# Patient Record
Sex: Male | Born: 1965 | Race: White | Hispanic: No | Smoking: Current every day smoker
Health system: Southern US, Community
[De-identification: ages and names within clinical notes are randomized; demographics above are authoritative.]

## PROBLEM LIST (undated history)

## (undated) DIAGNOSIS — G8929 Other chronic pain: Secondary | ICD-10-CM

## (undated) DIAGNOSIS — K219 Gastro-esophageal reflux disease without esophagitis: Secondary | ICD-10-CM

## (undated) DIAGNOSIS — N200 Calculus of kidney: Secondary | ICD-10-CM

## (undated) HISTORY — DX: Gastro-esophageal reflux disease without esophagitis: K21.9

## (undated) HISTORY — PX: KNEE SURGERY: SHX244

## (undated) HISTORY — PX: ANKLE SURGERY: SHX546

## (undated) HISTORY — PX: HUMERUS SURGERY: SHX672

## (undated) HISTORY — PX: CHOLECYSTECTOMY: SHX55

## (undated) HISTORY — PX: OTHER SURGICAL HISTORY: SHX169

## (undated) HISTORY — PX: FOREARM FRACTURE SURGERY: SHX649

---

## 2005-08-05 ENCOUNTER — Emergency Department (HOSPITAL_COMMUNITY): Admission: EM | Admit: 2005-08-05 | Discharge: 2005-08-06 | Payer: Self-pay | Admitting: *Deleted

## 2007-01-17 ENCOUNTER — Ambulatory Visit: Payer: Self-pay | Admitting: Cardiology

## 2007-06-07 ENCOUNTER — Emergency Department (HOSPITAL_COMMUNITY): Admission: EM | Admit: 2007-06-07 | Discharge: 2007-06-07 | Payer: Self-pay | Admitting: *Deleted

## 2007-08-08 ENCOUNTER — Ambulatory Visit: Payer: Self-pay | Admitting: Gastroenterology

## 2007-08-15 ENCOUNTER — Ambulatory Visit: Payer: Self-pay | Admitting: Gastroenterology

## 2007-08-15 ENCOUNTER — Encounter: Payer: Self-pay | Admitting: Gastroenterology

## 2007-09-07 ENCOUNTER — Emergency Department (HOSPITAL_COMMUNITY): Admission: EM | Admit: 2007-09-07 | Discharge: 2007-09-07 | Payer: Self-pay | Admitting: Emergency Medicine

## 2008-02-25 DIAGNOSIS — K2940 Chronic atrophic gastritis without bleeding: Secondary | ICD-10-CM | POA: Insufficient documentation

## 2010-12-25 ENCOUNTER — Encounter: Payer: Self-pay | Admitting: Gastroenterology

## 2011-04-17 NOTE — Assessment & Plan Note (Signed)
[facility]                         GASTROENTEROLOGY OFFICE NOTE   VIPUL, CAFARELLI                         MRN:          [id number]  DATE:08/08/2007                            DOB:          06-29-1966    REASON FOR CONSULTATION:  Nausea, vomiting, epigastric pain and reflux  symptoms.   HISTORY OF PRESENT ILLNESS:  Mr. Koval is a 45 year old white male  followed by Dr. Sherryll Burger.  The patient relates over a 25 year history of  heartburn and indigestion.  He states he has been on various medications  over the years and most recently treated with Nexium 40 mg twice a day  and Ranitidine once a day.  Despite this regimen he has had worsening  problems with heartburn, indigestion, nausea, vomiting, regurgitation,  epigastric pain, lower chest pain, gas, bloating, loose stools and solid  food dysphagia for approximately 1 to 2 months.  He has had a weight  increase over the past several months as well.  A recent abdominal  ultrasound performed at [facility] on August 01, 2007 showed  diffuse fatty infiltrative changes of the liver and no other  abnormalities.  Patient has not previously had upper endoscopy or an  upper GI series.  There is no family history of colon cancer, colon  polyps or inflammatory bowel disease.  He denies odynophagia,  hematemesis, melena or hematochezia.  He states he has noted occasional  episodes in the past of small amounts of bright red blood when he has  had repeated vomiting.   PAST MEDICAL HISTORY:  1. Hypertriglyceridemia.  2. GERD.   CURRENT MEDICATIONS:  1. Nexium 40 mg p.o. b.i.d.  2. Ranitidine unknown dosage daily.   MEDICATION ALLERGIES:  NONE KNOWN.   SOCIAL HISTORY:  He is separated with 2 children.  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  He is a cigarette  smoker and drinks a mild to moderate amount of alcohol.   REVIEW OF SYSTEMS:  As per the handwritten form.   PHYSICAL EXAMINATION:  Overweight white male in no acute distress.  Height 6 feet, weight 242.4 pounds, blood pressure 152/86, pulse 76 and  regular.  HEENT:  Anicteric sclerae, oropharynx clear.  CHEST:  Clear to auscultation bilaterally.  CARDIAC:  Regular rate and rhythm without murmurs appreciated.  ABDOMEN:  Soft with mild epigastric tenderness to deep palpation, no  rebound or guarding.  No palpable organomegaly, masses or hernias.  Normoactive bowel sounds.  NEUROLOGIC:  Alert and oriented x3.  Grossly nonfocal.   ASSESSMENT/PLAN:  1. Presumed refractory reflux symptoms and dysphagia.  Rule out ulcer      disease, erosive esophagitis, strictures, HH and gastroparesis.      Continue Nexium 40 mg p.o. b.i.d. along with Ranitidine daily.      Begin metoclopramide 10 mg a.c. and nightly.  Begin all standard      anti-reflux measures including the use of 4 inch bed blocks.      Risks, benefits and alternatives to upper endoscopy with possible      biopsy and  possible dilation discussed with the patient and he      consents to proceed.  This will be scheduled electively.  Pending      findings we may need to proceed with a gastric emptying scan and an      esophageal monometry.  If his symptoms are all related to      refractory reflux disease consideration will be given to surgical      referral for possible Nissen fundoplication.  2. Fatty liver. Low fat diet and long term weight loss to be followed      by Dr. Sherryll Burger.     Venita Lick. Russella Dar, MD, [facility]  Electronically Signed    MTS/MedQ  DD: 08/08/2007  DT: 08/08/2007  Job #: [id number]   cc:   Kirstie Peri, MD

## 2011-04-20 NOTE — Consult Note (Signed)
NAME:  Scott Lozano, Scott Lozano NO.:  [id number]   MEDICAL RECORD NO.:  [id number]          PATIENT TYPE:  EMS   LOCATION:  [facility]                         FACILITY:  [facility]   PHYSICIAN:  Hilda Lias, M.D.   DATE OF BIRTH:  04-22-1966   DATE OF CONSULTATION:  08/05/2005  DATE OF DISCHARGE:                                   CONSULTATION   HISTORY OF PRESENT ILLNESS:  Mr. Borg was transferred from [facility].  The history goes that after Eber Jones called me and I talked to the  doctor in the emergency room at [facility] about this patient at  around 11 a.m. this morning.  He felt a pop sensation in his lower back.  As  well, he had a lot of pain down both legs.  The patient was seen in [place]  at 1 p.m.  They did a work-up and they called me about 6:15.  The history  was given me that he was unable to move his legs and they were worried about  a spinal cord injury.  I asked the doctor if he did a rectal examination.  He told me none was done, but he told me that a CT scan was negative.  I  agreed with the history and for the patient to be transferred to [facility].  I was going to call the MRI.  The next day, at about 9:30, the  patient still had not shown up in the [facility], on the  neurosurgical floor, and we called [facility].  The patient was still  there in the emergency room and the nurse who was taking care of him was  telling me that he was awake, moving his leg with minimal discomfort.  Also  she gave me the information that the patient's blood test was positive for  illegal drugs.  Because of that I told her that probably the patient needs  to be transferred to the emergency room to be seen there to learn the source  of the pain that he is having.  Nevertheless, after that the patient was  transferred and now he came to the emergency room.  Mick Deflack, who is the  nurse in charge of the hospital today, called me to find out  why I was not  letting the patient on my floor.  I explained to him that I cancelled the  transfer the patient was in the emergency room at [facility] because  of the changes in the information that I got from the nurses.  Nevertheless,  the patient came to the emergency room and I came immediately to see the  patient.  He is with his mother and his brother.   According to him, he tells me that many years ago, about four years, he had  surgery on his neck in [place] because of herniated disk.  He has known  that he has a bulge in the center of his back.  He, through the years, has  had some back pain which comes and goes, and  he tells me that every time he  has some back pain, he goes to a neighbor, from whom he gets Xanax and  diazepam, as well as a medication for pain.  The mother gave me information  that since he did not get any medication for pain, except Tramadol in the  emergency room at [facility], a friend brought him some Demerol and methadone  for him to take.  Right now he tells me that has some numbness that goes  from the hip to the knees, but from the knees down is normal.  He denies any  problem with his bladder or bowels.  He is able to move his leg, but he has  some pain when he moves his leg.  Past history is positive for cervical  fusion in [place] in the cervical area.   PHYSICAL EXAMINATION:  HEENT:  Normal.  NECK:  He has a scar anteriorly.  He is able to flex and extend with some  discomfort.  LUNGS:  Mild rhonchi bilaterally.  HEART:  Heart sounds normal.  ABDOMEN:  Normal.  EXTREMITIES:  Normal pulse.  NEUROLOGIC:  He is alert x3.  Cranial nerves normal.  Strength:  Normal in  the upper extremities.  Lower extremities he is able to lift both legs.  He  is able to flex the knees but he has quite a bit of pain, mostly in the  lumbar area.  Sensation:  Normal.  He has no priapism.  Reflexes 2+.  No  Babinski.  Sensation is normal to pinprick and touch.   There is no sensory  deficit whatsoever.  RECTAL:  Normal.  BACK:  Palpation of the lumbar spine is quite painful.  He has quite a bit  of spasm on the lumbar spine.   Unfortunately, the CT scan was not sent but the information I got from the  [facility] emergency room, it was negative for any fracture and they  did not see any evidence of any hematoma.   LABORATORY DATA:  The blood tests were positive for marijuana and cocaine.   IMPRESSION:  Lower back pain.  No evidence of any __________.   RECOMMENDATIONS:  I talked to him, his brother and mother.  I told him that  probably we can admit him to the hospital to have an MRI done but the  patient tells me that he wants to go home now that he is able to sit and go  ahead and take his medication and have the MRI done in [place] where he  knows people from the x-ray department.  In that case, he is going to be  discharged and he will call us as needed.           ______________________________  Hilda Lias, M.D.     EB/MEDQ  D:  08/05/2005  T:  08/06/2005  Job:  [id number]

## 2012-09-05 ENCOUNTER — Encounter (HOSPITAL_COMMUNITY): Payer: Self-pay | Admitting: *Deleted

## 2012-09-05 ENCOUNTER — Emergency Department (HOSPITAL_COMMUNITY)
Admission: EM | Admit: 2012-09-05 | Discharge: 2012-09-05 | Disposition: A | Discharge status: Home / Self Care | Payer: Medicaid Other | Attending: Emergency Medicine | Admitting: Emergency Medicine

## 2012-09-05 ENCOUNTER — Emergency Department (HOSPITAL_COMMUNITY): Payer: Medicaid Other

## 2012-09-05 DIAGNOSIS — R109 Unspecified abdominal pain: Secondary | ICD-10-CM | POA: Insufficient documentation

## 2012-09-05 DIAGNOSIS — Z9089 Acquired absence of other organs: Secondary | ICD-10-CM | POA: Insufficient documentation

## 2012-09-05 DIAGNOSIS — R319 Hematuria, unspecified: Secondary | ICD-10-CM | POA: Insufficient documentation

## 2012-09-05 DIAGNOSIS — Z79899 Other long term (current) drug therapy: Secondary | ICD-10-CM | POA: Insufficient documentation

## 2012-09-05 HISTORY — DX: Calculus of kidney: N20.0

## 2012-09-05 LAB — URINE MICROSCOPIC-ADD ON

## 2012-09-05 LAB — URINALYSIS, ROUTINE W REFLEX MICROSCOPIC
Glucose, UA: NEGATIVE mg/dL
Protein, ur: NEGATIVE mg/dL

## 2012-09-05 MED ORDER — ONDANSETRON HCL 4 MG/2ML IJ SOLN
4.0000 mg | Freq: Once | INTRAMUSCULAR | Status: AC
  Administered 2012-09-05: 4 mg via INTRAVENOUS
  Filled 2012-09-05: qty 2

## 2012-09-05 MED ORDER — METRONIDAZOLE 500 MG PO TABS
500.0000 mg | ORAL_TABLET | Freq: Once | ORAL | Status: AC
  Administered 2012-09-05: 500 mg via ORAL

## 2012-09-05 MED ORDER — HYDROMORPHONE HCL PF 1 MG/ML IJ SOLN
1.0000 mg | Freq: Once | INTRAMUSCULAR | Status: AC
  Administered 2012-09-05: 1 mg via INTRAVENOUS
  Filled 2012-09-05: qty 1

## 2012-09-05 MED ORDER — HYDROMORPHONE HCL PF 2 MG/ML IJ SOLN
2.0000 mg | Freq: Once | INTRAMUSCULAR | Status: AC
  Administered 2012-09-05: 2 mg via INTRAVENOUS
  Filled 2012-09-05: qty 1

## 2012-09-05 MED ORDER — METRONIDAZOLE 500 MG PO TABS
ORAL_TABLET | ORAL | Status: AC
  Filled 2012-09-05: qty 1

## 2012-09-05 MED ORDER — CIPROFLOXACIN HCL 250 MG PO TABS
500.0000 mg | ORAL_TABLET | Freq: Once | ORAL | Status: AC
  Administered 2012-09-05: 500 mg via ORAL

## 2012-09-05 MED ORDER — CIPROFLOXACIN HCL 250 MG PO TABS
ORAL_TABLET | ORAL | Status: AC
  Filled 2012-09-05: qty 2

## 2012-09-05 NOTE — ED Notes (Signed)
Bladder scanner preformed per Neville Route, PA. Patient had 61ml of urine recorded in bladder.
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
## 2012-09-05 NOTE — ED Notes (Signed)
Pt discharged. Pt stable at time of discharge. Medications reviewed pt has no questions regarding discharge at this time. Pt voiced understanding of discharge instructions.  

## 2012-09-05 NOTE — ED Notes (Signed)
Seen at [facility] and thought to have kidney stone, but unable to do ct.  Now having problem with dysuria.

## 2012-09-06 NOTE — ED Provider Notes (Signed)
Medical screening examination/treatment/procedure(s) were performed by non-physician practitioner and as supervising physician I was immediately available for consultation/collaboration.  Reichen Hutzler, MD 09/06/12 0105 

## 2014-06-08 ENCOUNTER — Emergency Department (HOSPITAL_COMMUNITY)
Admission: EM | Admit: 2014-06-08 | Discharge: 2014-06-08 | Disposition: A | Discharge status: Home / Self Care | Payer: Worker's Compensation | Attending: Emergency Medicine | Admitting: Emergency Medicine

## 2014-06-08 ENCOUNTER — Emergency Department (HOSPITAL_COMMUNITY): Payer: Worker's Compensation

## 2014-06-08 DIAGNOSIS — F17200 Nicotine dependence, unspecified, uncomplicated: Secondary | ICD-10-CM | POA: Insufficient documentation

## 2014-06-08 DIAGNOSIS — M545 Low back pain, unspecified: Secondary | ICD-10-CM | POA: Insufficient documentation

## 2014-06-08 DIAGNOSIS — Z79899 Other long term (current) drug therapy: Secondary | ICD-10-CM | POA: Insufficient documentation

## 2014-06-08 DIAGNOSIS — M25561 Pain in right knee: Secondary | ICD-10-CM

## 2014-06-08 DIAGNOSIS — M25569 Pain in unspecified knee: Secondary | ICD-10-CM | POA: Insufficient documentation

## 2014-06-08 DIAGNOSIS — Z87442 Personal history of urinary calculi: Secondary | ICD-10-CM | POA: Insufficient documentation

## 2014-06-08 MED ORDER — OXYCODONE-ACETAMINOPHEN 5-325 MG PO TABS: 2.0000 | ORAL_TABLET | ORAL | Status: DC | PRN

## 2014-06-08 MED ORDER — ONDANSETRON HCL 4 MG/2ML IJ SOLN
4.0000 mg | Freq: Once | INTRAMUSCULAR | Status: AC
  Administered 2014-06-08: 4 mg via INTRAVENOUS
  Filled 2014-06-08: qty 2

## 2014-06-08 MED ORDER — PROMETHAZINE HCL 25 MG PO TABS: 25.0000 mg | ORAL_TABLET | Freq: Four times a day (QID) | ORAL | Status: DC | PRN

## 2014-06-08 MED ORDER — MORPHINE SULFATE 4 MG/ML IJ SOLN
4.0000 mg | Freq: Once | INTRAMUSCULAR | Status: AC
  Administered 2014-06-08: 4 mg via INTRAVENOUS
  Filled 2014-06-08: qty 1

## 2014-06-08 NOTE — ED Notes (Addendum)
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 Medial knee pain 8/10 after 10mg  IV Morphine by EMS.
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
## 2014-06-08 NOTE — Discharge Instructions (Signed)
X-rays show no fracture.  You probably have injuried your medial collateral ligament on the right knee.  Followup with orthopedic surgeon. Phone number given. Medication for pain and nausea.  Knee immobilizer, crutches, ice, elevate

## 2015-01-26 ENCOUNTER — Emergency Department (HOSPITAL_COMMUNITY): Payer: Managed Care, Other (non HMO)

## 2015-01-26 ENCOUNTER — Encounter (HOSPITAL_COMMUNITY): Payer: Self-pay | Admitting: *Deleted

## 2015-01-26 ENCOUNTER — Emergency Department (HOSPITAL_COMMUNITY)
Admission: EM | Admit: 2015-01-26 | Discharge: 2015-01-26 | Disposition: A | Discharge status: Home / Self Care | Payer: Managed Care, Other (non HMO) | Attending: Emergency Medicine | Admitting: Emergency Medicine

## 2015-01-26 DIAGNOSIS — S0990XA Unspecified injury of head, initial encounter: Secondary | ICD-10-CM | POA: Diagnosis present

## 2015-01-26 DIAGNOSIS — S060X0A Concussion without loss of consciousness, initial encounter: Secondary | ICD-10-CM | POA: Diagnosis not present

## 2015-01-26 DIAGNOSIS — Y9389 Activity, other specified: Secondary | ICD-10-CM | POA: Diagnosis not present

## 2015-01-26 DIAGNOSIS — S3991XA Unspecified injury of abdomen, initial encounter: Secondary | ICD-10-CM | POA: Diagnosis not present

## 2015-01-26 DIAGNOSIS — Z87828 Personal history of other (healed) physical injury and trauma: Secondary | ICD-10-CM | POA: Insufficient documentation

## 2015-01-26 DIAGNOSIS — G8929 Other chronic pain: Secondary | ICD-10-CM | POA: Insufficient documentation

## 2015-01-26 DIAGNOSIS — Z8505 Personal history of malignant neoplasm of liver: Secondary | ICD-10-CM | POA: Diagnosis not present

## 2015-01-26 DIAGNOSIS — S59902A Unspecified injury of left elbow, initial encounter: Secondary | ICD-10-CM | POA: Insufficient documentation

## 2015-01-26 DIAGNOSIS — M25561 Pain in right knee: Secondary | ICD-10-CM | POA: Insufficient documentation

## 2015-01-26 DIAGNOSIS — Z79899 Other long term (current) drug therapy: Secondary | ICD-10-CM | POA: Insufficient documentation

## 2015-01-26 DIAGNOSIS — R4182 Altered mental status, unspecified: Secondary | ICD-10-CM | POA: Diagnosis not present

## 2015-01-26 DIAGNOSIS — Z87442 Personal history of urinary calculi: Secondary | ICD-10-CM | POA: Diagnosis not present

## 2015-01-26 DIAGNOSIS — Y9289 Other specified places as the place of occurrence of the external cause: Secondary | ICD-10-CM | POA: Diagnosis not present

## 2015-01-26 DIAGNOSIS — S79911A Unspecified injury of right hip, initial encounter: Secondary | ICD-10-CM | POA: Diagnosis not present

## 2015-01-26 DIAGNOSIS — Y998 Other external cause status: Secondary | ICD-10-CM | POA: Insufficient documentation

## 2015-01-26 DIAGNOSIS — Z720 Tobacco use: Secondary | ICD-10-CM | POA: Insufficient documentation

## 2015-01-26 DIAGNOSIS — W109XXA Fall (on) (from) unspecified stairs and steps, initial encounter: Secondary | ICD-10-CM | POA: Diagnosis not present

## 2015-01-26 HISTORY — DX: Other chronic pain: G89.29

## 2015-01-26 LAB — CBC WITH DIFFERENTIAL/PLATELET
BASOS ABS: 0.1 10*3/uL (ref 0.0–0.1)
Basophils Relative: 1 % (ref 0–1)
EOS PCT: 6 % — AB (ref 0–5)
Eosinophils Absolute: 0.4 10*3/uL (ref 0.0–0.7)
HEMATOCRIT: 42.2 % (ref 39.0–52.0)
HEMOGLOBIN: 14.7 g/dL (ref 13.0–17.0)
Lymphocytes Relative: 43 % (ref 12–46)
Lymphs Abs: 2.9 10*3/uL (ref 0.7–4.0)
MCH: 30.8 pg (ref 26.0–34.0)
MCHC: 34.8 g/dL (ref 30.0–36.0)
MCV: 88.5 fL (ref 78.0–100.0)
MONO ABS: 0.5 10*3/uL (ref 0.1–1.0)
MONOS PCT: 8 % (ref 3–12)
Neutro Abs: 2.7 10*3/uL (ref 1.7–7.7)
Neutrophils Relative %: 42 % — ABNORMAL LOW (ref 43–77)
Platelets: 252 10*3/uL (ref 150–400)
RBC: 4.77 MIL/uL (ref 4.22–5.81)
RDW: 12.2 % (ref 11.5–15.5)
WBC: 6.5 10*3/uL (ref 4.0–10.5)

## 2015-01-26 LAB — AMMONIA: Ammonia: 82 umol/L — ABNORMAL HIGH (ref 11–32)

## 2015-01-26 LAB — URINALYSIS, ROUTINE W REFLEX MICROSCOPIC
Bilirubin Urine: NEGATIVE
GLUCOSE, UA: NEGATIVE mg/dL
HGB URINE DIPSTICK: NEGATIVE
Ketones, ur: NEGATIVE mg/dL
LEUKOCYTES UA: NEGATIVE
Nitrite: NEGATIVE
Protein, ur: NEGATIVE mg/dL
SPECIFIC GRAVITY, URINE: 1.019 (ref 1.005–1.030)
UROBILINOGEN UA: 0.2 mg/dL (ref 0.0–1.0)
pH: 5.5 (ref 5.0–8.0)

## 2015-01-26 LAB — COMPREHENSIVE METABOLIC PANEL
ALK PHOS: 81 U/L (ref 39–117)
ALT: 14 U/L (ref 0–53)
ANION GAP: 6 (ref 5–15)
AST: 17 U/L (ref 0–37)
Albumin: 3.5 g/dL (ref 3.5–5.2)
BILIRUBIN TOTAL: 0.3 mg/dL (ref 0.3–1.2)
BUN: 13 mg/dL (ref 6–23)
CO2: 32 mmol/L (ref 19–32)
Calcium: 9.2 mg/dL (ref 8.4–10.5)
Chloride: 100 mmol/L (ref 96–112)
Creatinine, Ser: 1.16 mg/dL (ref 0.50–1.35)
GFR, EST AFRICAN AMERICAN: 85 mL/min — AB (ref >90)
GFR, EST NON AFRICAN AMERICAN: 73 mL/min — AB (ref >90)
GLUCOSE: 110 mg/dL — AB (ref 70–99)
POTASSIUM: 4.5 mmol/L (ref 3.5–5.1)
SODIUM: 138 mmol/L (ref 135–145)
Total Protein: 6.9 g/dL (ref 6.0–8.3)

## 2015-01-26 MED ORDER — OXYCODONE HCL 5 MG PO TABS
10.0000 mg | ORAL_TABLET | Freq: Once | ORAL | Status: AC
  Administered 2015-01-26: 10 mg via ORAL
  Filled 2015-01-26: qty 2

## 2015-01-26 MED ORDER — SUCRALFATE 1 GM/10ML PO SUSP: 1.0000 g | Freq: Three times a day (TID) | ORAL | Status: DC

## 2015-01-26 NOTE — ED Notes (Signed)
Patient had surgery on his knee in August.  He has had frequent falls due to coming down steps.  He states his knee gives away.  Patient states he has hit his head multiple times.  He states he has a burning pain in his head.  He states is not himself and feels uncoordinated.  Patient has had 7 falls since Saturday.  Patient with weakness all over, worse 2 days ago with a droop in his face and altered gait.  He also has ringing in his ears.  Patient is alert.  He is able to verbalized his needs.  He states he has pain in both knees, and left elbow.  Patient reported to fall down 13 steps today.  Patient has not taken any pain meds today.  Patient denies loc.  He is also complaining of nausea.  He has not been able to keep anything down for 2 weeks.  He has not had bm for 4 days

## 2015-01-26 NOTE — ED Notes (Signed)
Abby PA at bedside
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
## 2015-01-26 NOTE — ED Notes (Signed)
Patient transported to MRI 

## 2015-01-26 NOTE — Discharge Instructions (Signed)
Concussion  A concussion, or closed-head injury, is a brain injury caused by a direct blow to the head or by a quick and sudden movement (jolt) of the head or neck. Concussions are usually not life-threatening. Even so, the effects of a concussion can be serious. If you have had a concussion before, you are more likely to experience concussion-like symptoms after a direct blow to the head.   CAUSES  · Direct blow to the head, such as from running into another player during a soccer game, being hit in a fight, or hitting your head on a hard surface.  · A jolt of the head or neck that causes the brain to move back and forth inside the skull, such as in a car crash.  SIGNS AND SYMPTOMS  The signs of a concussion can be hard to notice. Early on, they may be missed by you, family members, and health care providers. You may look fine but act or feel differently.  Symptoms are usually temporary, but they may last for days, weeks, or even longer. Some symptoms may appear right away while others may not show up for hours or days. Every head injury is different. Symptoms include:  · Mild to moderate headaches that will not go away.  · A feeling of pressure inside your head.  · Having more trouble than usual:  · Learning or remembering things you have heard.  · Answering questions.  · Paying attention or concentrating.  · Organizing daily tasks.  · Making decisions and solving problems.  · Slowness in thinking, acting or reacting, speaking, or reading.  · Getting lost or being easily confused.  · Feeling tired all the time or lacking energy (fatigued).  · Feeling drowsy.  · Sleep disturbances.  · Sleeping more than usual.  · Sleeping less than usual.  · Trouble falling asleep.  · Trouble sleeping (insomnia).  · Loss of balance or feeling lightheaded or dizzy.  · Nausea or vomiting.  · Numbness or tingling.  · Increased sensitivity to:  · Sounds.  · Lights.  · Distractions.  · Vision problems or eyes that tire  easily.  · Diminished sense of taste or smell.  · Ringing in the ears.  · Mood changes such as feeling sad or anxious.  · Becoming easily irritated or angry for little or no reason.  · Lack of motivation.  · Seeing or hearing things other people do not see or hear (hallucinations).  DIAGNOSIS  Your health care provider can usually diagnose a concussion based on a description of your injury and symptoms. He or she will ask whether you passed out (lost consciousness) and whether you are having trouble remembering events that happened right before and during your injury.  Your evaluation might include:  · A brain scan to look for signs of injury to the brain. Even if the test shows no injury, you may still have a concussion.  · Blood tests to be sure other problems are not present.  TREATMENT  · Concussions are usually treated in an emergency department, in urgent care, or at a clinic. You may need to stay in the hospital overnight for further treatment.  · Tell your health care provider if you are taking any medicines, including prescription medicines, over-the-counter medicines, and natural remedies. Some medicines, such as blood thinners (anticoagulants) and aspirin, may increase the chance of complications. Also tell your health care provider whether you have had alcohol or are taking illegal drugs. This information   may affect treatment.  · Your health care provider will send you home with important instructions to follow.  · How fast you will recover from a concussion depends on many factors. These factors include how severe your concussion is, what part of your brain was injured, your age, and how healthy you were before the concussion.  · Most people with mild injuries recover fully. Recovery can take time. In general, recovery is slower in older persons. Also, persons who have had a concussion in the past or have other medical problems may find that it takes longer to recover from their current injury.  HOME  CARE INSTRUCTIONS  General Instructions  · Carefully follow the directions your health care provider gave you.  · Only take over-the-counter or prescription medicines for pain, discomfort, or fever as directed by your health care provider.  · Take only those medicines that your health care provider has approved.  · Do not drink alcohol until your health care provider says you are well enough to do so. Alcohol and certain other drugs may slow your recovery and can put you at risk of further injury.  · If it is harder than usual to remember things, write them down.  · If you are easily distracted, try to do one thing at a time. For example, do not try to watch TV while fixing dinner.  · Talk with family members or close friends when making important decisions.  · Keep all follow-up appointments. Repeated evaluation of your symptoms is recommended for your recovery.  · Watch your symptoms and tell others to do the same. Complications sometimes occur after a concussion. Older adults with a brain injury may have a higher risk of serious complications, such as a blood clot on the brain.  · Tell your teachers, school nurse, school counselor, coach, athletic trainer, or work manager about your injury, symptoms, and restrictions. Tell them about what you can or cannot do. They should watch for:  ¨ Increased problems with attention or concentration.  ¨ Increased difficulty remembering or learning new information.  ¨ Increased time needed to complete tasks or assignments.  ¨ Increased irritability or decreased ability to cope with stress.  ¨ Increased symptoms.  · Rest. Rest helps the brain to heal. Make sure you:  ¨ Get plenty of sleep at night. Avoid staying up late at night.  ¨ Keep the same bedtime hours on weekends and weekdays.  ¨ Rest during the day. Take daytime naps or rest breaks when you feel tired.  · Limit activities that require a lot of thought or concentration. These include:  ¨ Doing homework or job-related  work.  ¨ Watching TV.  ¨ Working on the computer.  · Avoid any situation where there is potential for another head injury (football, hockey, soccer, basketball, martial arts, downhill snow sports and horseback riding). Your condition will get worse every time you experience a concussion. You should avoid these activities until you are evaluated by the appropriate follow-up health care providers.  Returning To Your Regular Activities  You will need to return to your normal activities slowly, not all at once. You must give your body and brain enough time for recovery.  · Do not return to sports or other athletic activities until your health care provider tells you it is safe to do so.  · Ask your health care provider when you can drive, ride a bicycle, or operate heavy machinery. Your ability to react may be slower after a   brain injury. Never do these activities if you are dizzy.  · Ask your health care provider about when you can return to work or school.  Preventing Another Concussion  It is very important to avoid another brain injury, especially before you have recovered. In rare cases, another injury can lead to permanent brain damage, brain swelling, or death. The risk of this is greatest during the first 7-10 days after a head injury. Avoid injuries by:  · Wearing a seat belt when riding in a car.  · Drinking alcohol only in moderation.  · Wearing a helmet when biking, skiing, skateboarding, skating, or doing similar activities.  · Avoiding activities that could lead to a second concussion, such as contact or recreational sports, until your health care provider says it is okay.  · Taking safety measures in your home.  ¨ Remove clutter and tripping hazards from floors and stairways.  ¨ Use grab bars in bathrooms and handrails by stairs.  ¨ Place non-slip mats on floors and in bathtubs.  ¨ Improve lighting in dim areas.  SEEK MEDICAL CARE IF:  · You have increased problems paying attention or  concentrating.  · You have increased difficulty remembering or learning new information.  · You need more time to complete tasks or assignments than before.  · You have increased irritability or decreased ability to cope with stress.  · You have more symptoms than before.  Seek medical care if you have any of the following symptoms for more than 2 weeks after your injury:  · Lasting (chronic) headaches.  · Dizziness or balance problems.  · Nausea.  · Vision problems.  · Increased sensitivity to noise or light.  · Depression or mood swings.  · Anxiety or irritability.  · Memory problems.  · Difficulty concentrating or paying attention.  · Sleep problems.  · Feeling tired all the time.  SEEK IMMEDIATE MEDICAL CARE IF:  · You have severe or worsening headaches. These may be a sign of a blood clot in the brain.  · You have weakness (even if only in one hand, leg, or part of the face).  · You have numbness.  · You have decreased coordination.  · You vomit repeatedly.  · You have increased sleepiness.  · One pupil is larger than the other.  · You have convulsions.  · You have slurred speech.  · You have increased confusion. This may be a sign of a blood clot in the brain.  · You have increased restlessness, agitation, or irritability.  · You are unable to recognize people or places.  · You have neck pain.  · It is difficult to wake you up.  · You have unusual behavior changes.  · You lose consciousness.  MAKE SURE YOU:  · Understand these instructions.  · Will watch your condition.  · Will get help right away if you are not doing well or get worse.  Document Released: 02/09/2004 Document Revised: 11/24/2013 Document Reviewed: 06/11/2013  Exit[employer]® Patient Information ©2015 [employer]. This information is not intended to replace advice given to you by your health care provider. Make sure you discuss any questions you have with your health care provider.  Post-Concussion Syndrome  Post-concussion syndrome describes the  symptoms that can occur after a head injury. These symptoms can last from weeks to months.  CAUSES   It is not clear why some head injuries cause post-concussion syndrome. It can occur whether your head injury was mild or severe   and whether you were wearing head protection or not.   SIGNS AND SYMPTOMS  · Memory difficulties.  · Dizziness.  · Headaches.  · Double vision or blurry vision.  · Sensitivity to light.  · Hearing difficulties.  · Depression.  · Tiredness.  · Weakness.  · Difficulty with concentration.  · Difficulty sleeping or staying asleep.  · Vomiting.  · Poor balance or instability on your feet.  · Slow reaction time.  · Difficulty learning and remembering things you have heard.  DIAGNOSIS   There is no test to determine whether you have post-concussion syndrome. Your health care provider may order an imaging scan of your brain, such as a CT scan, to check for other problems that may be causing your symptoms (such as severe injury inside your skull).  TREATMENT   Usually, these problems disappear over time without medical care. Your health care provider may prescribe medicine to help ease your symptoms. It is important to follow up with a neurologist to evaluate your recovery and address any lingering symptoms or issues.  HOME CARE INSTRUCTIONS   · Only take over-the-counter or prescription medicines for pain, discomfort, or fever as directed by your health care provider. Do not take aspirin. Aspirin can slow blood clotting.  · Sleep with your head slightly elevated to help with headaches.  · Avoid any situation where there is potential for another head injury (football, hockey, soccer, basketball, martial arts, downhill snow sports, and horseback riding). Your condition will get worse every time you experience a concussion. You should avoid these activities until you are evaluated by the appropriate follow-up health care providers.  · Keep all follow-up appointments as directed by your health care  provider.  SEEK IMMEDIATE MEDICAL CARE IF:  · You develop confusion or unusual drowsiness.  · You cannot wake the injured person.  · You develop nausea or persistent, forceful vomiting.  · You feel like you are moving when you are not (vertigo).  · You notice the injured person's eyes moving rapidly back and forth. This may be a sign of vertigo.  · You have convulsions or faint.  · You have severe, persistent headaches that are not relieved by medicine.  · You cannot use your arms or legs normally.  · Your pupils change size.  · You have clear or bloody discharge from the nose or ears.  · Your problems are getting worse, not better.  MAKE SURE YOU:  · Understand these instructions.  · Will watch your condition.  · Will get help right away if you are not doing well or get worse.  Document Released: 05/11/2002 Document Revised: 09/09/2013 Document Reviewed: 02/24/2014  Exit[employer]® Patient Information ©2015 [employer]. This information is not intended to replace advice given to you by your health care provider. Make sure you discuss any questions you have with your health care provider.

## 2015-03-07 ENCOUNTER — Ambulatory Visit (INDEPENDENT_AMBULATORY_CARE_PROVIDER_SITE_OTHER): Payer: Managed Care, Other (non HMO) | Admitting: Neurology

## 2015-03-07 ENCOUNTER — Encounter: Payer: Self-pay | Admitting: Neurology

## 2015-03-07 VITALS — BP 120/76 | HR 88 | Ht 72.0 in | Wt 197.0 lb

## 2015-03-07 DIAGNOSIS — R296 Repeated falls: Secondary | ICD-10-CM

## 2015-03-07 DIAGNOSIS — G44309 Post-traumatic headache, unspecified, not intractable: Secondary | ICD-10-CM | POA: Insufficient documentation

## 2015-03-07 MED ORDER — AMITRIPTYLINE HCL 10 MG PO TABS: ORAL_TABLET | ORAL | Status: DC

## 2015-03-07 NOTE — Patient Instructions (Signed)
1. Start amitriptyline 10mg : Take 1 tablet at bedtime for 1 week, then increase to 2 tablets at bedtime for 1 week, then increase to 3 tablets at bedtime. Monitor for drowsiness 2. Use cane at all times 3. Follow-up in 2 months
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
## 2015-03-13 ENCOUNTER — Encounter: Payer: Self-pay | Admitting: Neurology

## 2015-03-13 DIAGNOSIS — R296 Repeated falls: Secondary | ICD-10-CM | POA: Insufficient documentation

## 2015-05-06 DIAGNOSIS — Z029 Encounter for administrative examinations, unspecified: Secondary | ICD-10-CM

## 2015-05-08 ENCOUNTER — Encounter (HOSPITAL_COMMUNITY): Payer: Self-pay | Admitting: Emergency Medicine

## 2015-05-08 ENCOUNTER — Emergency Department (HOSPITAL_COMMUNITY)
Admission: EM | Admit: 2015-05-08 | Discharge: 2015-05-08 | Disposition: A | Discharge status: Home / Self Care | Payer: Managed Care, Other (non HMO) | Attending: Emergency Medicine | Admitting: Emergency Medicine

## 2015-05-08 ENCOUNTER — Emergency Department (HOSPITAL_COMMUNITY): Payer: Managed Care, Other (non HMO)

## 2015-05-08 DIAGNOSIS — Y998 Other external cause status: Secondary | ICD-10-CM | POA: Diagnosis not present

## 2015-05-08 DIAGNOSIS — S6991XA Unspecified injury of right wrist, hand and finger(s), initial encounter: Secondary | ICD-10-CM | POA: Diagnosis present

## 2015-05-08 DIAGNOSIS — Y9389 Activity, other specified: Secondary | ICD-10-CM | POA: Insufficient documentation

## 2015-05-08 DIAGNOSIS — S60221A Contusion of right hand, initial encounter: Secondary | ICD-10-CM | POA: Insufficient documentation

## 2015-05-08 DIAGNOSIS — K219 Gastro-esophageal reflux disease without esophagitis: Secondary | ICD-10-CM | POA: Insufficient documentation

## 2015-05-08 DIAGNOSIS — Y9289 Other specified places as the place of occurrence of the external cause: Secondary | ICD-10-CM | POA: Diagnosis not present

## 2015-05-08 DIAGNOSIS — W108XXA Fall (on) (from) other stairs and steps, initial encounter: Secondary | ICD-10-CM | POA: Diagnosis not present

## 2015-05-08 DIAGNOSIS — Z87442 Personal history of urinary calculi: Secondary | ICD-10-CM | POA: Insufficient documentation

## 2015-05-08 DIAGNOSIS — G8929 Other chronic pain: Secondary | ICD-10-CM | POA: Insufficient documentation

## 2015-05-08 DIAGNOSIS — Z79899 Other long term (current) drug therapy: Secondary | ICD-10-CM | POA: Diagnosis not present

## 2015-05-08 MED ORDER — OXYCODONE-ACETAMINOPHEN 5-325 MG PO TABS
1.0000 | ORAL_TABLET | Freq: Once | ORAL | Status: AC
  Administered 2015-05-08: 1 via ORAL
  Filled 2015-05-08: qty 1
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
## 2015-05-08 NOTE — ED Notes (Signed)
Patient c/o right hand pain that radiates into right forearm. Patient states that he was falling down steps and caught self with hand, feeling hand and wrist pop. Per patient feels throbbing sensation in hand. Denies taking any medication for pain.

## 2015-05-08 NOTE — Discharge Instructions (Signed)
Read the information below.  You may return to the Emergency Department at any time for worsening condition or any new symptoms that concern you.  If you develop uncontrolled pain, weakness or numbness of the extremity, severe discoloration of the skin, or you are unable to move your hand, return to the ER for a recheck.      Hand Contusion A hand contusion is a deep bruise on your hand area. Contusions are the result of an injury that caused bleeding under the skin. The contusion may turn blue, purple, or yellow. Minor injuries will give you a painless contusion, but more severe contusions may stay painful and swollen for a few weeks. CAUSES  A contusion is usually caused by a blow, trauma, or direct force to an area of the body. SYMPTOMS   Swelling and redness of the injured area.  Discoloration of the injured area.  Tenderness and soreness of the injured area.  Pain. DIAGNOSIS  The diagnosis can be made by taking a history and performing a physical exam. An X-ray, CT scan, or MRI may be needed to determine if there were any associated injuries, such as broken bones (fractures). TREATMENT  Often, the best treatment for a hand contusion is resting, elevating, icing, and applying cold compresses to the injured area. Over-the-counter medicines may also be recommended for pain control. HOME CARE INSTRUCTIONS   Put ice on the injured area.  Put ice in a plastic bag.  Place a towel between your skin and the bag.  Leave the ice on for 15-20 minutes, 03-04 times a day.  Only take over-the-counter or prescription medicines as directed by your caregiver. Your caregiver may recommend avoiding anti-inflammatory medicines (aspirin, ibuprofen, and naproxen) for 48 hours because these medicines may increase bruising.  If told, use an elastic wrap as directed. This can help reduce swelling. You may remove the wrap for sleeping, showering, and bathing. If your fingers become numb, cold, or blue,  take the wrap off and reapply it more loosely.  Elevate your hand with pillows to reduce swelling.  Avoid overusing your hand if it is painful. SEEK IMMEDIATE MEDICAL CARE IF:   You have increased redness, swelling, or pain in your hand.  Your swelling or pain is not relieved with medicines.  You have loss of feeling in your hand or are unable to move your fingers.  Your hand turns cold or blue.  You have pain when you move your fingers.  Your hand becomes warm to the touch.  Your contusion does not improve in 2 days. MAKE SURE YOU:   Understand these instructions.  Will watch your condition.  Will get help right away if you are not doing well or get worse. Document Released: 05/11/2002 Document Revised: 08/13/2012 Document Reviewed: 05/12/2012 [facility] Patient Information 2015 [employer] [place], [place]. This information is not intended to replace advice given to you by your health care provider. Make sure you discuss any questions you have with your health care provider.

## 2015-05-09 ENCOUNTER — Encounter: Payer: Self-pay | Admitting: Neurology

## 2015-05-09 ENCOUNTER — Ambulatory Visit: Payer: Self-pay | Admitting: Neurology
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
## 2016-01-24 IMAGING — MR MR HEAD W/O CM
9 of 11 series · 35 of 48 positions shown · non-contrast
Comparison: CT head 01/26/2015

CLINICAL DATA: Altered mental status. Multiple falls. Head injury.
Headache.

EXAM:
MRI HEAD WITHOUT CONTRAST
TECHNIQUE: Multiplanar, multiecho pulse sequences of the brain and surrounding
structures were obtained without intravenous contrast.

[Series 2: FLAIR · sagittal · 5.0mm · 0.47mm/px · 1 of 23 slices shown (1 of 2)]
[im 1/23]
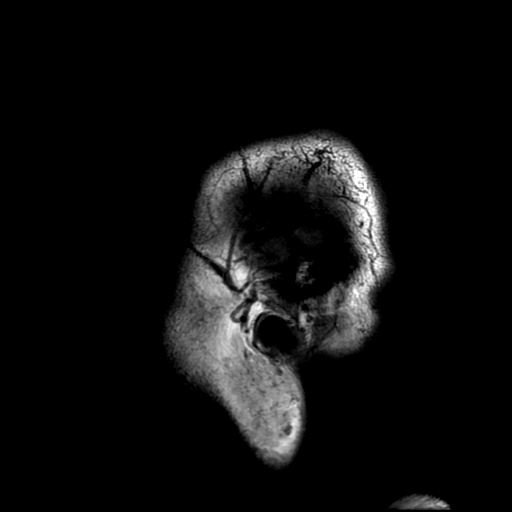

[Series 4: DWI · axial · 3.0mm · 0.94mm/px · z∈[-87,+51]mm · 9 of 94 slices shown (1 of 4)]
[im 1/94]
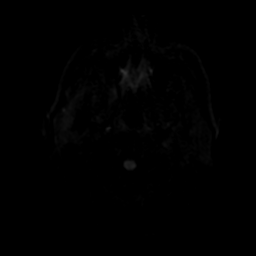
[im 12/94]
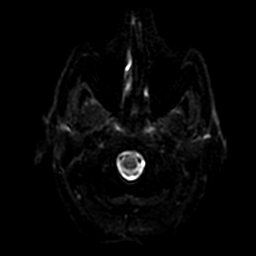
[im 24/94]
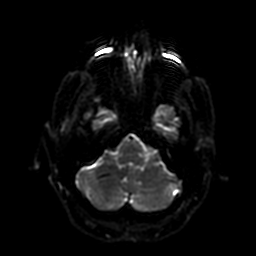
[im 35/94]
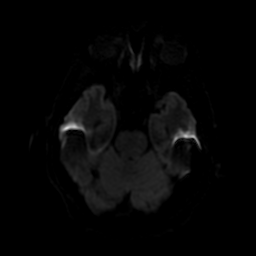
[im 47/94]
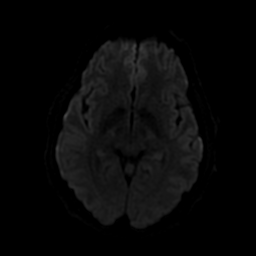
[im 59/94]
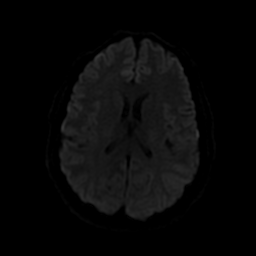
[im 70/94]
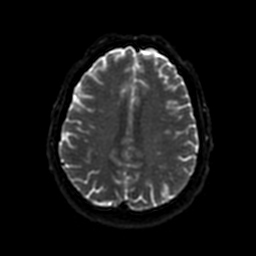
[im 82/94]
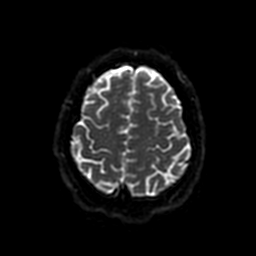
[im 94/94]
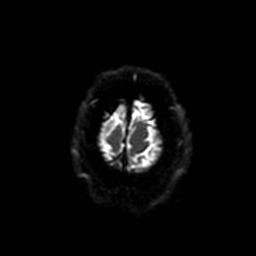

[Series 5: (person_name) · axial · 3.0mm · 0.47mm/px · z∈[-88,+0]mm · 5 of 96 slices shown]
[im 1/96]
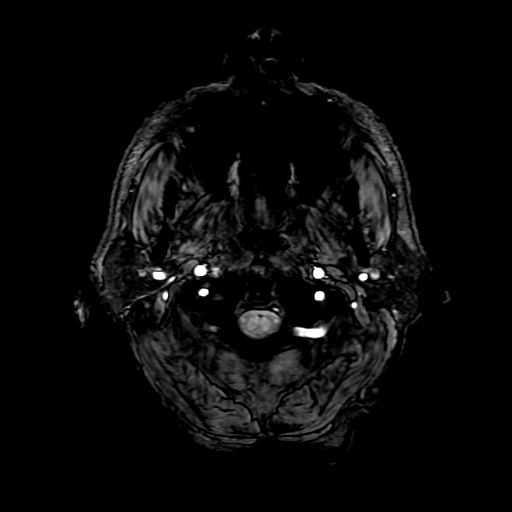
[im 12/96]
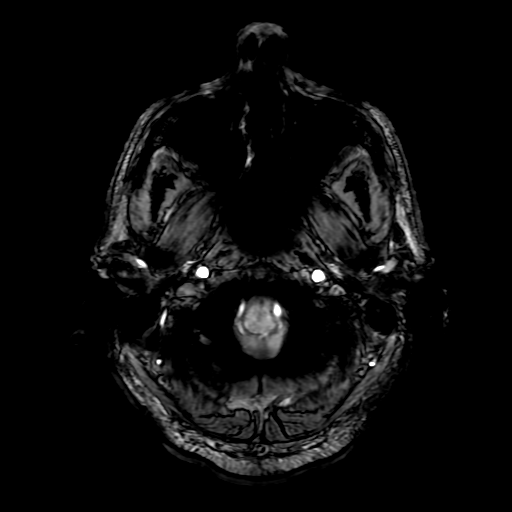
[im 24/96]
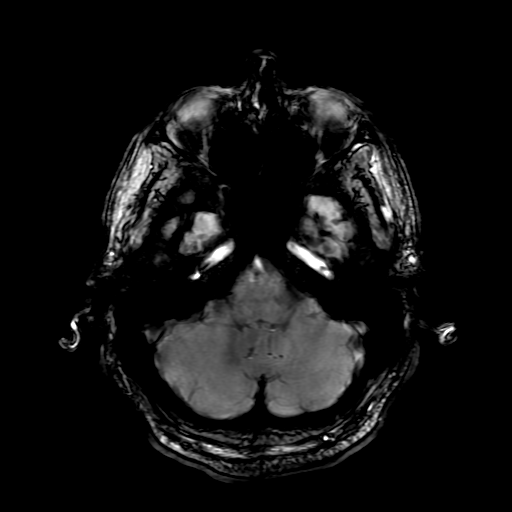
[im 36/96]
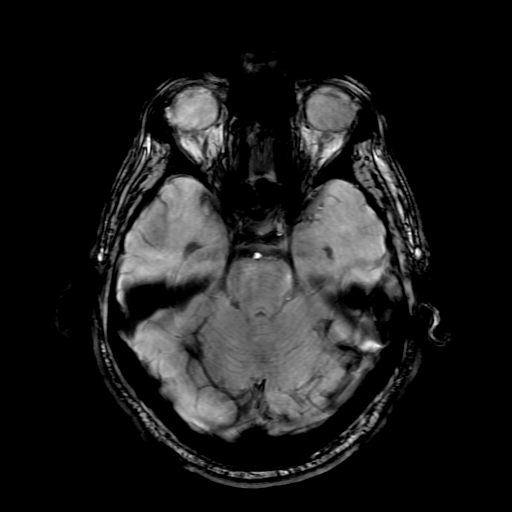
[im 60/96]
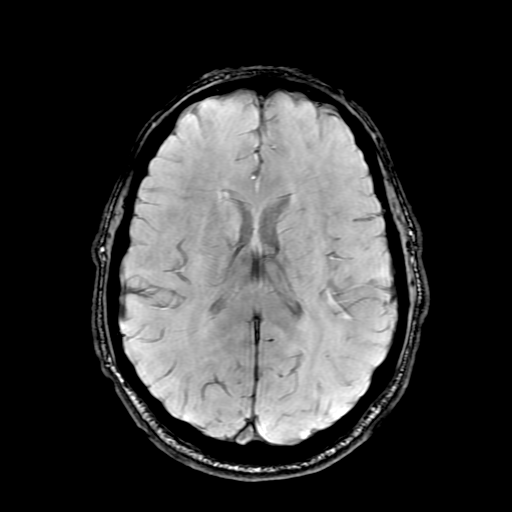

[Series 6: DWI · coronal · 5.0mm · 0.94mm/px · 6 of 66 slices shown (2 of 4)]
[im 1/66]
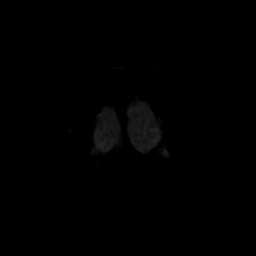
[im 14/66]
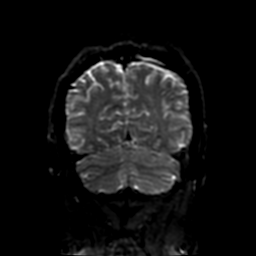
[im 27/66]
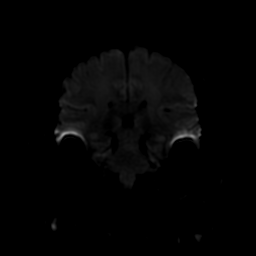
[im 40/66]
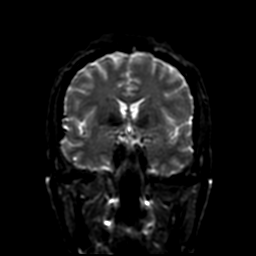
[im 53/66]
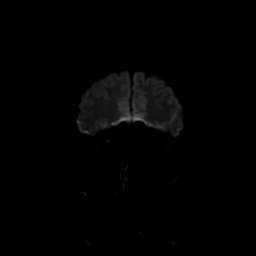
[im 66/66]
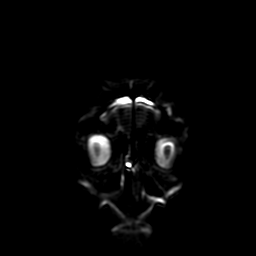

[Series 7: T2 · axial · 5.0mm · 0.47mm/px · z∈[-87,+51]mm · 2 of 24 slices shown (1 of 2)]
[im 1/24]
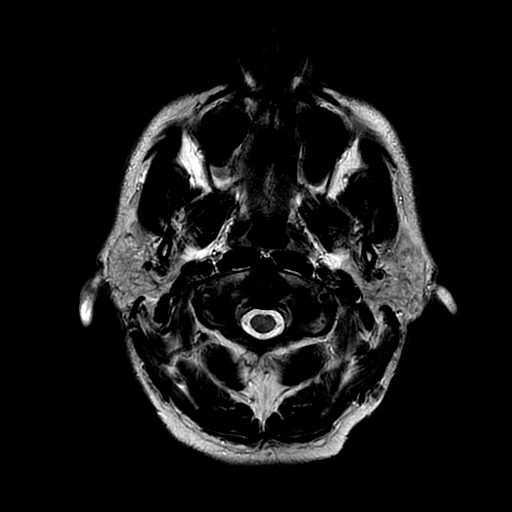
[im 24/24]
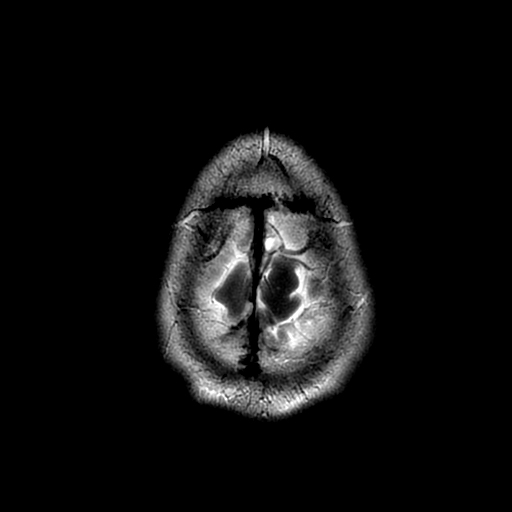

[Series 8: FLAIR · axial · 5.0mm · 0.47mm/px · z∈[-87,+51]mm · 2 of 24 slices shown (2 of 2)]
[im 1/24]
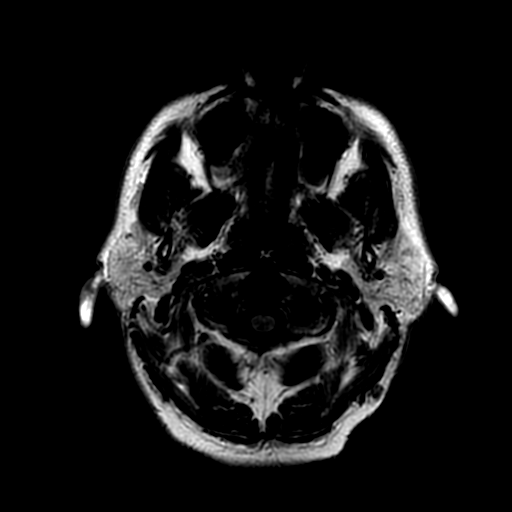
[im 24/24]
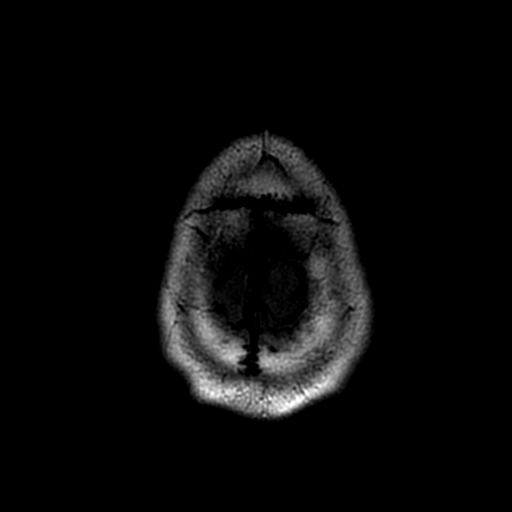

[Series 10: T2 · coronal · 5.0mm · 0.39mm/px · 3 of 28 slices shown (2 of 2)]
[im 1/28]
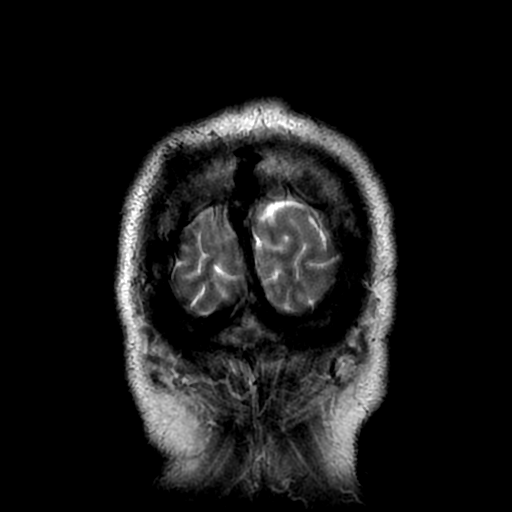
[im 14/28]
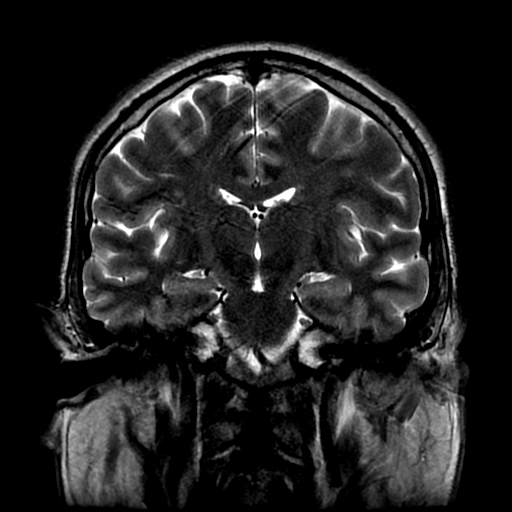
[im 28/28]
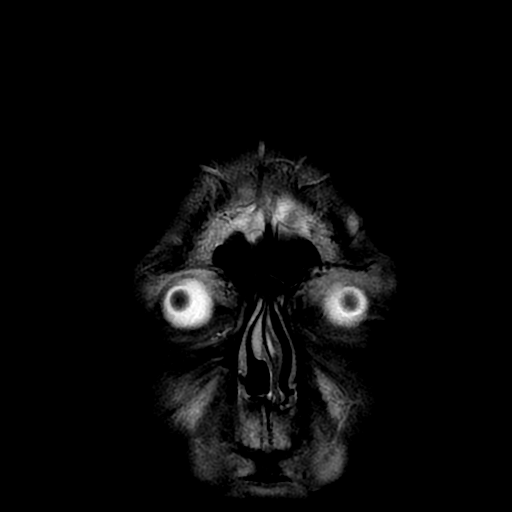

[Series 400: DWI · axial · 3.0mm · 0.94mm/px · z∈[-87,+51]mm · 4 of 47 slices shown (3 of 4)]
[im 1/47]
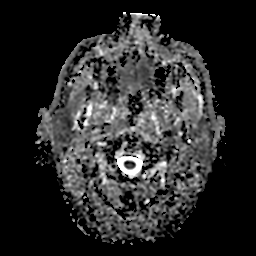
[im 16/47]
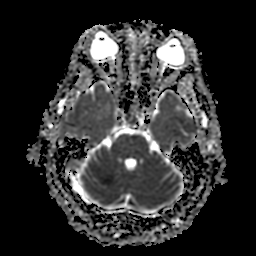
[im 31/47]
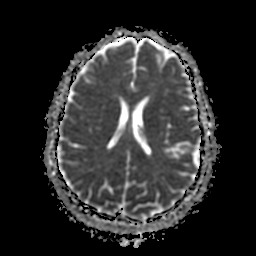
[im 47/47]
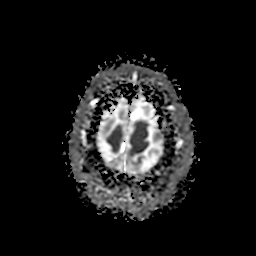

[Series 600: DWI · coronal · 5.0mm · 0.94mm/px · 3 of 33 slices shown (4 of 4)]
[im 1/33]
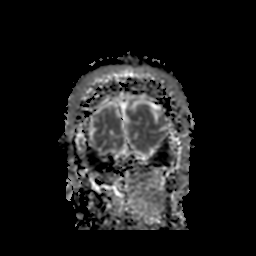
[im 17/33]
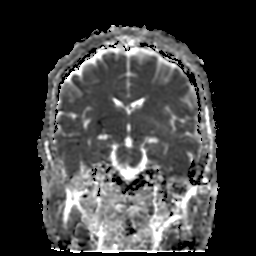
[im 33/33]
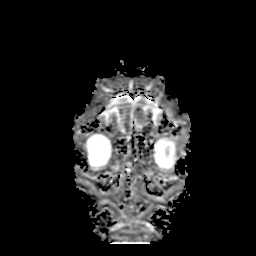

[35 of 48 positions shown; findings below may reference images not displayed]

FINDINGS: Ventricle size is normal. Cerebral volume is normal. Negative for
Chiari malformation. Pituitary normal in size.

Negative for acute infarct. Scattered small deep white matter
hyperintensities consistent with mild chronic microvascular
ischemia. Brainstem and cerebellum normal

Negative for intracranial hemorrhage. Small right parietal scalp
hematoma as noted on CT. No cerebral contusion or edema is
identified

Negative for mass lesion.
IMPRESSION: No acute intracranial abnormality. No acute infarct or contusion. No
intracranial hemorrhage.

## 2016-01-24 IMAGING — CT CT HEAD W/O CM
1 of 2 series · 15 of 30 positions shown, 19 images · non-contrast
Comparison: 06/07/2007 non contrast head CT.

CLINICAL DATA: 48-year-old male with altered mental status,
frequent falls. Generalized weakness. Abnormal gait and tinnitus.
Initial encounter.

EXAM:
CT HEAD WITHOUT CONTRAST
TECHNIQUE: Contiguous axial images were obtained from the base of the skull
through the vertex without intravenous contrast.

[Series 3: head 2.0 h70h · axial · 0.45mm/px · z∈[-94,+46]mm · 15 of 78 slices shown, 19 images]
[im 4/78  brain]
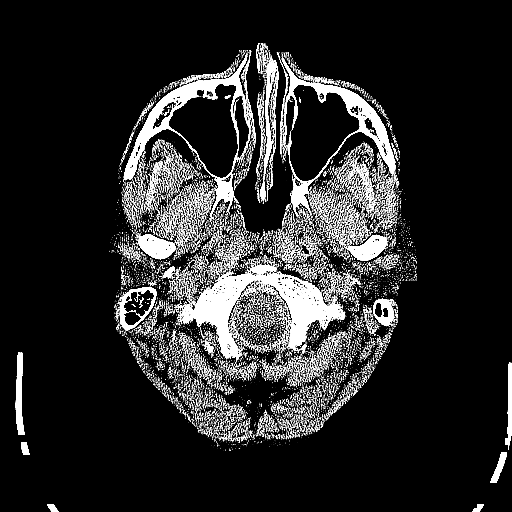
[im 4/78  bone]
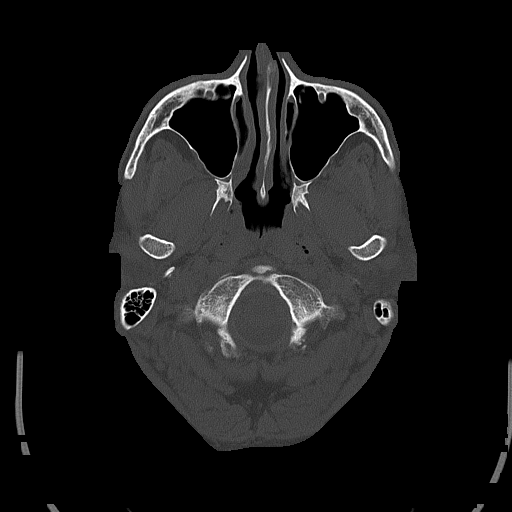
[im 8/78  brain]
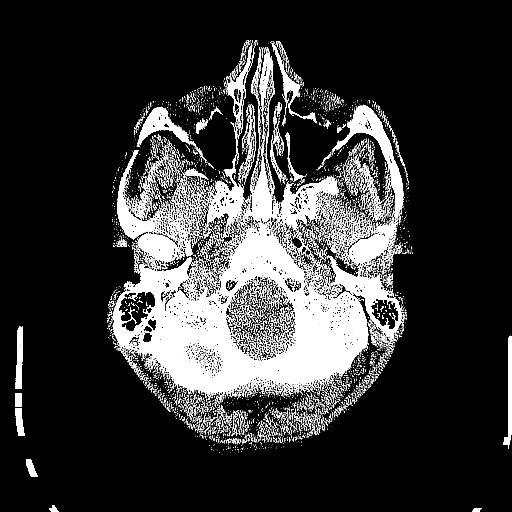
[im 16/78  brain]
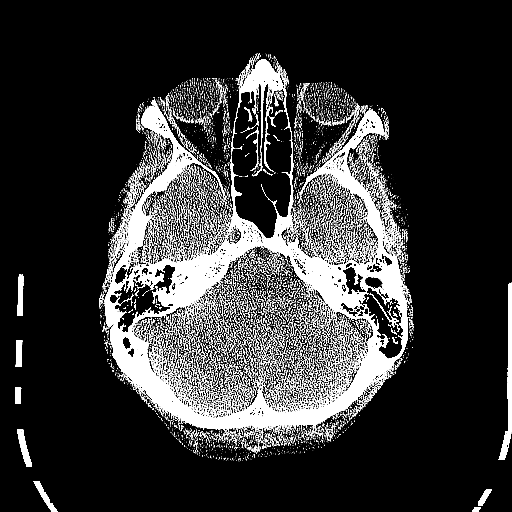
[im 20/78  brain]
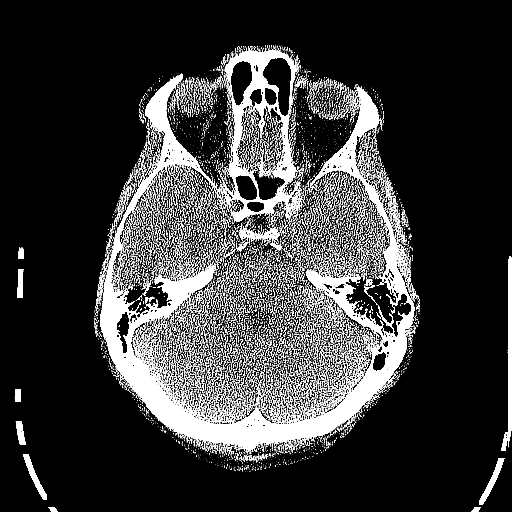
[im 24/78  brain]
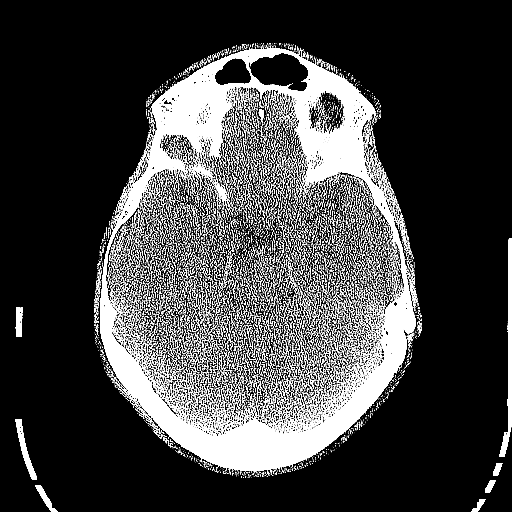
[im 24/78  bone]
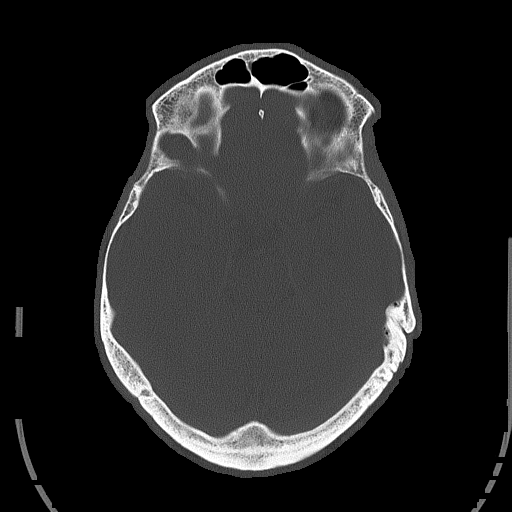
[im 27/78  brain]
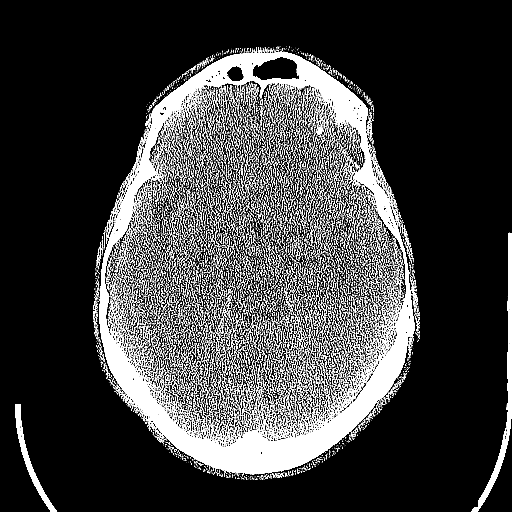
[im 35/78  brain]
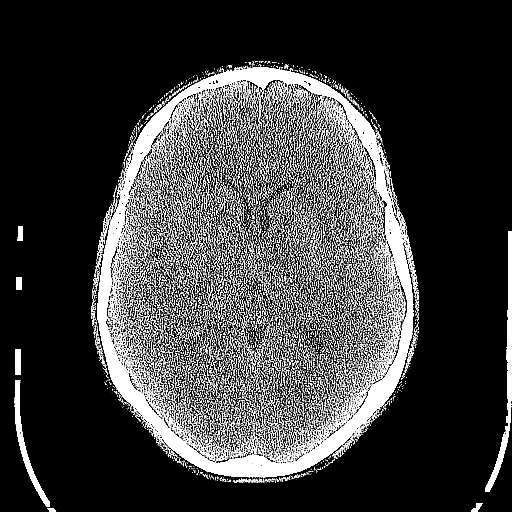
[im 39/78  brain]
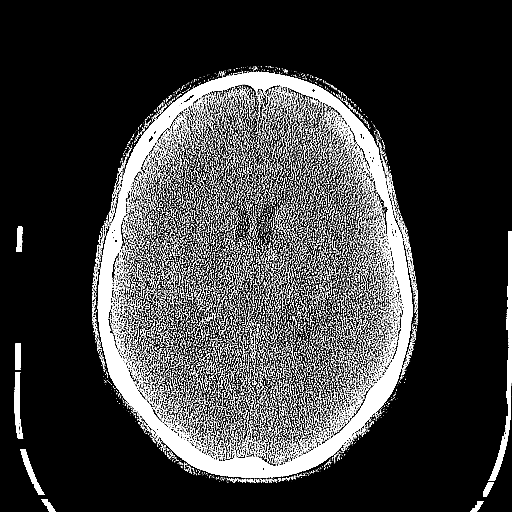
[im 43/78  brain]
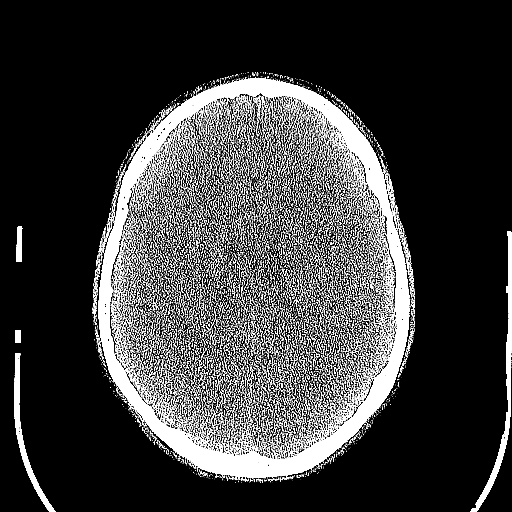
[im 43/78  bone]
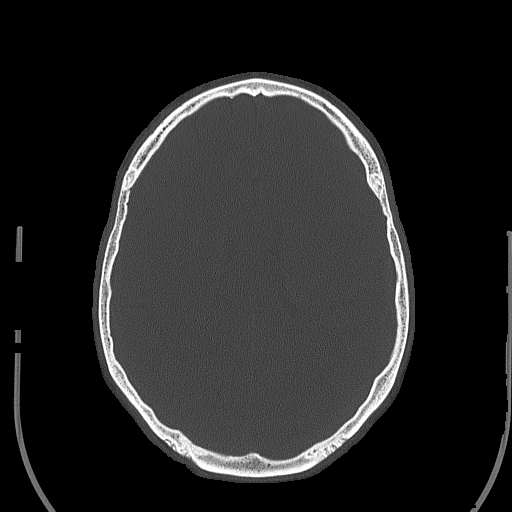
[im 51/78  brain]
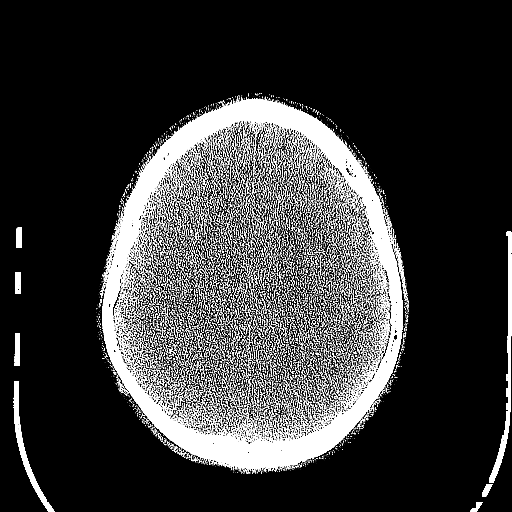
[im 54/78  brain]
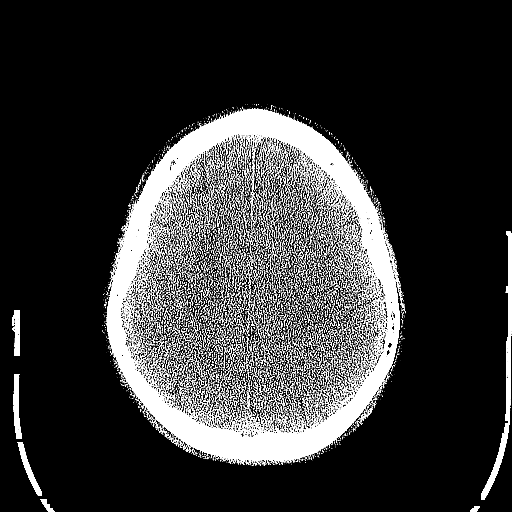
[im 58/78  brain]
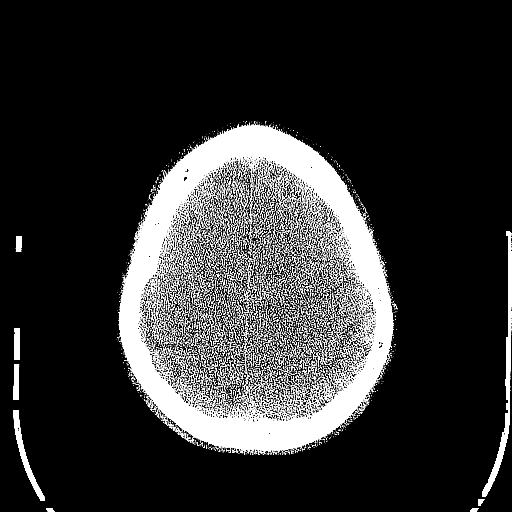
[im 62/78  brain]
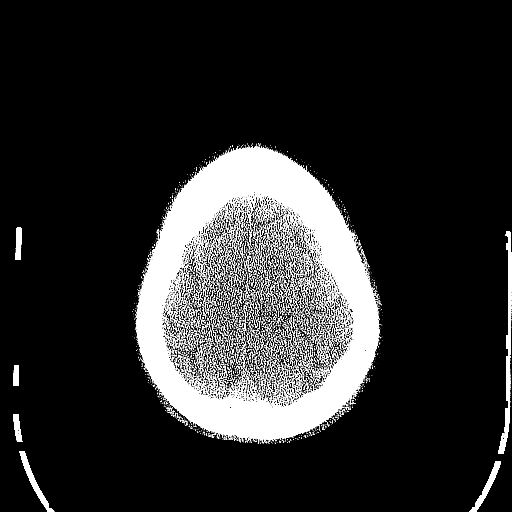
[im 62/78  bone]
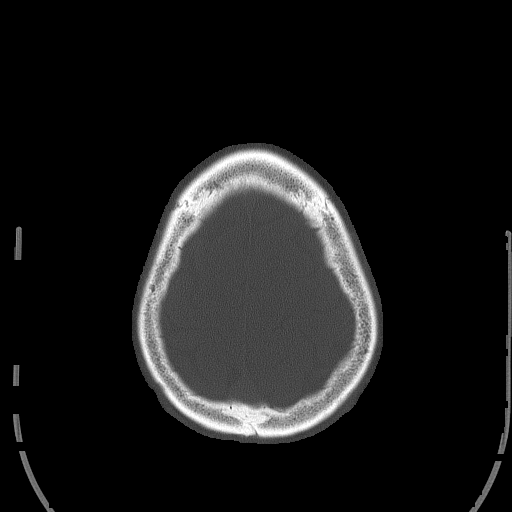
[im 70/78  brain]
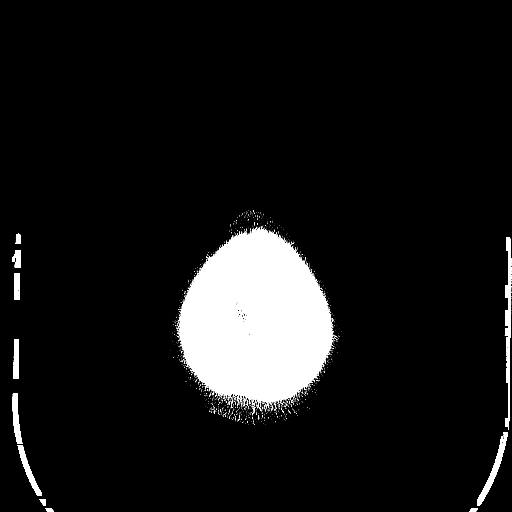
[im 74/78  brain]
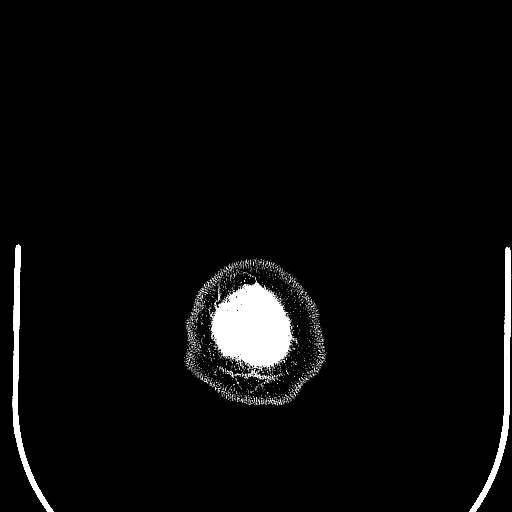

[15 of 30 positions shown; findings below may reference images not displayed]

FINDINGS: Stable and negative paranasal sinuses and mastoids. Broad-based
posterior scalp hematoma most apparent on series 3, image 54.
Underlying calvarium intact. No acute osseous abnormality
identified. Visualized orbit soft tissues are within normal limits.

Cerebral volume is within normal limits for age. No suspicious
intracranial vascular hyperdensity. No midline shift,
ventriculomegaly, mass effect, evidence of mass lesion, intracranial
hemorrhage or evidence of cortically based acute infarction.
Gray-white matter differentiation is within normal limits throughout
the brain.
IMPRESSION: 1. Broad-based posterior scalp hematoma without underlying fracture.
2. Stable and Normal noncontrast CT appearance of the brain.

## 2017-03-18 ENCOUNTER — Encounter (HOSPITAL_COMMUNITY): Payer: Self-pay | Admitting: Emergency Medicine

## 2017-03-18 ENCOUNTER — Emergency Department (HOSPITAL_COMMUNITY)
Admission: EM | Admit: 2017-03-18 | Discharge: 2017-03-18 | Disposition: A | Discharge status: Home / Self Care | Payer: Self-pay | Attending: Emergency Medicine | Admitting: Emergency Medicine

## 2017-03-18 ENCOUNTER — Emergency Department (HOSPITAL_COMMUNITY): Payer: Self-pay

## 2017-03-18 DIAGNOSIS — Y999 Unspecified external cause status: Secondary | ICD-10-CM | POA: Insufficient documentation

## 2017-03-18 DIAGNOSIS — Y939 Activity, unspecified: Secondary | ICD-10-CM | POA: Insufficient documentation

## 2017-03-18 DIAGNOSIS — Z5321 Procedure and treatment not carried out due to patient leaving prior to being seen by health care provider: Secondary | ICD-10-CM | POA: Insufficient documentation

## 2017-03-18 DIAGNOSIS — F17210 Nicotine dependence, cigarettes, uncomplicated: Secondary | ICD-10-CM | POA: Insufficient documentation

## 2017-03-18 DIAGNOSIS — W19XXXA Unspecified fall, initial encounter: Secondary | ICD-10-CM | POA: Insufficient documentation

## 2017-03-18 DIAGNOSIS — Y929 Unspecified place or not applicable: Secondary | ICD-10-CM | POA: Insufficient documentation

## 2017-03-18 DIAGNOSIS — M25562 Pain in left knee: Secondary | ICD-10-CM | POA: Insufficient documentation
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
## 2017-03-18 NOTE — ED Notes (Signed)
Pt left at this time.

## 2017-03-18 NOTE — ED Notes (Signed)
PA went into patient's room. Patient not in room.

## 2018-01-14 ENCOUNTER — Encounter (HOSPITAL_COMMUNITY): Payer: Self-pay | Admitting: Emergency Medicine

## 2018-01-14 ENCOUNTER — Other Ambulatory Visit: Payer: Self-pay

## 2018-01-14 ENCOUNTER — Emergency Department (HOSPITAL_COMMUNITY)
Admission: EM | Admit: 2018-01-14 | Discharge: 2018-01-14 | Disposition: A | Discharge status: Home / Self Care | Payer: Self-pay | Attending: Emergency Medicine | Admitting: Emergency Medicine

## 2018-01-14 ENCOUNTER — Emergency Department (HOSPITAL_COMMUNITY): Payer: Self-pay

## 2018-01-14 DIAGNOSIS — R079 Chest pain, unspecified: Secondary | ICD-10-CM | POA: Insufficient documentation

## 2018-01-14 DIAGNOSIS — Z5321 Procedure and treatment not carried out due to patient leaving prior to being seen by health care provider: Secondary | ICD-10-CM | POA: Insufficient documentation

## 2018-01-14 LAB — CBC
HCT: 48.1 % (ref 39.0–52.0)
HEMOGLOBIN: 16.2 g/dL (ref 13.0–17.0)
MCH: 31.4 pg (ref 26.0–34.0)
MCHC: 33.7 g/dL (ref 30.0–36.0)
MCV: 93.2 fL (ref 78.0–100.0)
Platelets: 300 10*3/uL (ref 150–400)
RBC: 5.16 MIL/uL (ref 4.22–5.81)
RDW: 12.4 % (ref 11.5–15.5)
WBC: 7.2 10*3/uL (ref 4.0–10.5)

## 2018-01-14 LAB — BASIC METABOLIC PANEL
Anion gap: 9 (ref 5–15)
BUN: 15 mg/dL (ref 6–20)
CHLORIDE: 102 mmol/L (ref 101–111)
CO2: 27 mmol/L (ref 22–32)
Calcium: 9.5 mg/dL (ref 8.9–10.3)
Creatinine, Ser: 1.14 mg/dL (ref 0.61–1.24)
GFR calc Af Amer: >60 mL/min (ref >60)
GFR calc non Af Amer: >60 mL/min (ref >60)
Glucose, Bld: 97 mg/dL (ref 65–99)
POTASSIUM: 4.4 mmol/L (ref 3.5–5.1)
SODIUM: 138 mmol/L (ref 135–145)

## 2018-01-14 LAB — TROPONIN I: Troponin I: <0.03 ng/mL (ref <0.03)

## 2018-01-14 NOTE — ED Notes (Signed)
Called to room pt no answer 

## 2018-01-14 NOTE — ED Notes (Signed)
Called no answer

## 2018-01-14 NOTE — ED Triage Notes (Signed)
Patient reports onset of chest pain 2 hours ago. Patient states he had SOB, nausea, and diaphoresis. Patient reports an episode last week as well.

## 2018-10-01 ENCOUNTER — Ambulatory Visit (INDEPENDENT_AMBULATORY_CARE_PROVIDER_SITE_OTHER): Payer: Self-pay | Admitting: Orthopaedic Surgery

## 2019-12-17 ENCOUNTER — Encounter: Payer: Self-pay | Admitting: Orthopaedic Surgery

## 2019-12-17 ENCOUNTER — Ambulatory Visit (INDEPENDENT_AMBULATORY_CARE_PROVIDER_SITE_OTHER): Payer: Self-pay

## 2019-12-17 ENCOUNTER — Other Ambulatory Visit: Payer: Self-pay

## 2019-12-17 ENCOUNTER — Ambulatory Visit (INDEPENDENT_AMBULATORY_CARE_PROVIDER_SITE_OTHER): Payer: Self-pay | Admitting: Orthopaedic Surgery

## 2019-12-17 VITALS — BP 131/92 | HR 96 | Ht 71.0 in | Wt 185.0 lb

## 2019-12-17 DIAGNOSIS — M545 Low back pain, unspecified: Secondary | ICD-10-CM

## 2019-12-17 MED ORDER — PREDNISONE 10 MG (21) PO TBPK: ORAL_TABLET | ORAL | 0 refills | Status: DC
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
## 2019-12-24 ENCOUNTER — Ambulatory Visit (INDEPENDENT_AMBULATORY_CARE_PROVIDER_SITE_OTHER): Payer: BC Managed Care – PPO | Admitting: Orthopaedic Surgery

## 2019-12-24 ENCOUNTER — Encounter: Payer: Self-pay | Admitting: Orthopaedic Surgery

## 2019-12-24 DIAGNOSIS — M48062 Spinal stenosis, lumbar region with neurogenic claudication: Secondary | ICD-10-CM

## 2019-12-24 DIAGNOSIS — M48061 Spinal stenosis, lumbar region without neurogenic claudication: Secondary | ICD-10-CM | POA: Insufficient documentation

## 2019-12-24 MED ORDER — HYDROCODONE-ACETAMINOPHEN 5-325 MG PO TABS: 1.0000 | ORAL_TABLET | Freq: Four times a day (QID) | ORAL | 0 refills | Status: DC | PRN
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
## 2019-12-24 NOTE — Addendum Note (Signed)
Addended by: Rogers Seeds on: 12/24/2019 02:18 PM   Modules accepted: Orders

## 2019-12-31 ENCOUNTER — Ambulatory Visit (INDEPENDENT_AMBULATORY_CARE_PROVIDER_SITE_OTHER): Payer: BC Managed Care – PPO | Admitting: Orthopaedic Surgery

## 2019-12-31 ENCOUNTER — Encounter: Payer: Self-pay | Admitting: Orthopaedic Surgery

## 2019-12-31 DIAGNOSIS — M5136 Other intervertebral disc degeneration, lumbar region: Secondary | ICD-10-CM | POA: Insufficient documentation
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
## 2020-01-12 ENCOUNTER — Telehealth: Payer: Self-pay | Admitting: Radiology

## 2020-01-12 NOTE — Telephone Encounter (Signed)
Patient called and states that he has not been paid in 5 weeks. He has spoken with his disability company and they informed him that they spoke with Dr. Ophelia Charter three weeks ago and were awaiting documentation from our office. I explained to patient that I had not seen disability forms, however, I would check to see if [employer] had received them.  Tammy called [employer]. Forms have been there since 12/24/2019. They mailed out packet for patient to complete and send in payment before they will fill out forms. Patient did not receive packet. Forms pulled back to office for me to complete for patient.

## 2020-01-25 ENCOUNTER — Other Ambulatory Visit: Payer: Self-pay | Admitting: Orthopaedic Surgery

## 2020-01-25 ENCOUNTER — Telehealth: Payer: Self-pay | Admitting: Radiology

## 2020-01-25 ENCOUNTER — Other Ambulatory Visit: Payer: Self-pay

## 2020-01-25 ENCOUNTER — Ambulatory Visit: Payer: Self-pay

## 2020-01-25 ENCOUNTER — Ambulatory Visit (INDEPENDENT_AMBULATORY_CARE_PROVIDER_SITE_OTHER): Payer: BC Managed Care – PPO | Admitting: Physical Medicine and Rehabilitation

## 2020-01-25 ENCOUNTER — Encounter: Payer: Self-pay | Admitting: Physical Medicine and Rehabilitation

## 2020-01-25 VITALS — BP 148/87 | HR 96

## 2020-01-25 DIAGNOSIS — M5416 Radiculopathy, lumbar region: Secondary | ICD-10-CM | POA: Diagnosis not present

## 2020-01-25 DIAGNOSIS — M5136 Other intervertebral disc degeneration, lumbar region: Secondary | ICD-10-CM

## 2020-01-25 MED ORDER — METHYLPREDNISOLONE ACETATE 80 MG/ML IJ SUSP
40.0000 mg | Freq: Once | INTRAMUSCULAR | Status: AC
  Administered 2020-01-25: 10:00:00 40 mg

## 2020-01-25 MED ORDER — HYDROCODONE-ACETAMINOPHEN 5-325 MG PO TABS: 1.0000 | ORAL_TABLET | Freq: Four times a day (QID) | ORAL | 0 refills | Status: AC | PRN

## 2020-01-25 NOTE — Progress Notes (Signed)
I called Norco # 30 tabs sent in .   5/325 . He ha appt Thursday. Pain worse since [facility]

## 2020-01-25 NOTE — Procedures (Signed)
Lumbar Epidural Steroid Injection - Interlaminar Approach with Fluoroscopic Guidance  Patient: Scott Lozano      Date of Birth: February 09, 1966 MRN: [id number] PCP: Patient, No Pcp Per      Visit Date: 01/25/2020   Universal Protocol:     Consent Given By: the patient  Position: PRONE  Additional Comments: Vital signs were monitored before and after the procedure. Patient was prepped and draped in the usual sterile fashion. The correct patient, procedure, and site was verified.   Injection Procedure Details:  Procedure Site One Meds Administered:  Meds ordered this encounter  Medications  . methylPREDNISolone acetate (DEPO-MEDROL) injection 40 mg     Laterality: Right  Location/Site:  L4-L5  Needle size: 20 G  Needle type: Tuohy  Needle Placement: Paramedian epidural  Findings:   -Comments: Excellent flow of contrast into the epidural space.  Procedure Details: Using a paramedian approach from the side mentioned above, the region overlying the inferior lamina was localized under fluoroscopic visualization and the soft tissues overlying this structure were infiltrated with 4 ml. of 1% Lidocaine without Epinephrine. The Tuohy needle was inserted into the epidural space using a paramedian approach.   The epidural space was localized using loss of resistance along with lateral and bi-planar fluoroscopic views.  After negative aspirate for air, blood, and CSF, a 2 ml. volume of Isovue-250 was injected into the epidural space and the flow of contrast was observed. Radiographs were obtained for documentation purposes.    The injectate was administered into the level noted above.   Additional Comments:  The patient tolerated the procedure well Dressing: 2 x 2 sterile gauze and Band-Aid    Post-procedure details: Patient was observed during the procedure. Post-procedure instructions were reviewed.  Patient left the clinic in stable condition.
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
## 2020-01-25 NOTE — Progress Notes (Signed)
 .  Numeric Pain Rating Scale and Functional Assessment Average Pain 8   In the last MONTH (on 0-10 scale) has pain interfered with the following?  1. General activity like being  able to carry out your everyday physical activities such as walking, climbing stairs, carrying groceries, or moving a chair?  Rating(8)   +Driver, -BT, -Dye Allergies.  

## 2020-01-25 NOTE — Telephone Encounter (Signed)
Patient requests something stronger for pain. Please send to [facility] in [place].  CB for patient is [phone].

## 2020-01-28 ENCOUNTER — Ambulatory Visit (INDEPENDENT_AMBULATORY_CARE_PROVIDER_SITE_OTHER): Payer: BC Managed Care – PPO | Admitting: Orthopaedic Surgery

## 2020-01-28 ENCOUNTER — Encounter: Payer: Self-pay | Admitting: Orthopaedic Surgery

## 2020-01-28 VITALS — BP 111/90 | HR 79 | Ht 71.0 in | Wt 185.0 lb

## 2020-01-28 DIAGNOSIS — M5136 Other intervertebral disc degeneration, lumbar region: Secondary | ICD-10-CM
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
## 2020-01-29 ENCOUNTER — Telehealth: Payer: Self-pay | Admitting: Radiology

## 2020-01-29 ENCOUNTER — Other Ambulatory Visit: Payer: Self-pay | Admitting: Orthopaedic Surgery

## 2020-01-29 MED ORDER — HYDROCODONE-ACETAMINOPHEN 5-325 MG PO TABS: 1.0000 | ORAL_TABLET | Freq: Two times a day (BID) | ORAL | 0 refills | Status: AC | PRN

## 2020-01-29 NOTE — Telephone Encounter (Signed)
done

## 2020-01-29 NOTE — Telephone Encounter (Signed)
Patient left voicemail stating pharmacy has not received refill on his Hydrocodone that was to be sent in yesterday.  [facility] [place].  Please advise.

## 2020-01-29 NOTE — Progress Notes (Signed)
Non

## 2020-02-18 ENCOUNTER — Encounter: Payer: Self-pay | Admitting: Orthopaedic Surgery

## 2020-02-18 ENCOUNTER — Ambulatory Visit (INDEPENDENT_AMBULATORY_CARE_PROVIDER_SITE_OTHER): Payer: BC Managed Care – PPO | Admitting: Orthopaedic Surgery

## 2020-02-18 ENCOUNTER — Other Ambulatory Visit: Payer: Self-pay

## 2020-02-18 VITALS — BP 146/98 | HR 93 | Ht 71.0 in | Wt 185.0 lb

## 2020-02-18 DIAGNOSIS — M5136 Other intervertebral disc degeneration, lumbar region: Secondary | ICD-10-CM | POA: Diagnosis not present
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
